# Patient Record
Sex: Male | Born: 2005 | Race: White | Hispanic: No | Marital: Single | Smoking: Never smoker
Health system: Southern US, Community
[De-identification: ages and names within clinical notes are randomized; demographics above are authoritative.]

---
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
## 2024-08-15 ENCOUNTER — Encounter: Payer: Self-pay | Admitting: Emergency Medicine

## 2024-08-15 ENCOUNTER — Emergency Department
Admission: EM | Admit: 2024-08-15 | Discharge: 2024-08-15 | Disposition: A | Discharge status: Home / Self Care | Attending: Emergency Medicine | Admitting: Emergency Medicine

## 2024-08-15 DIAGNOSIS — R109 Unspecified abdominal pain: Secondary | ICD-10-CM | POA: Diagnosis present

## 2024-08-15 DIAGNOSIS — L720 Epidermal cyst: Secondary | ICD-10-CM | POA: Diagnosis not present

## 2024-08-15 MED ORDER — DOXYCYCLINE HYCLATE 100 MG PO TABS: 100.0000 mg | ORAL_TABLET | Freq: Two times a day (BID) | ORAL | 0 refills | Status: AC

## 2024-08-15 NOTE — ED Provider Notes (Signed)
 "  [facility] Provider Note    Event Date/Time   First MD Initiated Contact with Patient 08/15/24 1512     (approximate)   History   Hernia    HPI  Philip Macias is a 19 y.o. male    with a past medical history of bilateral tinnitus, left wrist pain, neoplasm of the skin, hemochromatosis abdominal pain,, with no significant past medical history who presents to the ED complaining of bellybutton pain. According to the patient, symptoms started 24 hours ago with bloody secretion from his bellybutton.  Patient endorses is tender to palpation.  Patient denies fever, chills, abdominal pain, diarrhea.  This is his first episode.  He was itchy and patient was scratching it.  Patient is here with his mother.    Per independent chart review, patient had similar symptoms in March 17, 2018 with periumbilical abdominal pain for 2 weeks x-ray were normal  There are no active problems to display for this patient.    Physical Exam   Triage Vital Signs: ED Triage Vitals  Encounter Vitals Group     BP 08/15/24 1424 134/85     Girls Systolic BP Percentile --      Girls Diastolic BP Percentile --      Boys Systolic BP Percentile --      Boys Diastolic BP Percentile --      Pulse Rate 08/15/24 1424 79     Resp 08/15/24 1424 16     Temp 08/15/24 1426 98.6 F (37 C)     Temp Source 08/15/24 1426 Oral     SpO2 08/15/24 1424 100 %     Weight --      Height --      Head Circumference --      Peak Flow --      Pain Score 08/15/24 1424 0     Pain Loc --      Pain Education --      Exclude from Growth Chart --     Most recent vital signs: Vitals:   08/15/24 1424 08/15/24 1426  BP: 134/85   Pulse: 79   Resp: 16   Temp:  98.6 F (37 C)  SpO2: 100%      Physical Exam Vitals and nursing note reviewed.  During triage patient was hypertensive.  General:          Awake, no distress.  CV:                  Good peripheral perfusion. Regular rate and rhythm. Resp:                Normal effort. no tachypnea.Equal breath sounds bilaterally.  Abd:             Bowel sounds positive, skin is intact.    No distention.  Soft nontender.  Of induration in the upper border of the umbilical area.  Bloody dry secretion, I did clean it and obtain fresh scared blood. Other:               ED Results / Procedures / Treatments   Labs (all labs ordered are listed, but only abnormal results are displayed) Labs Reviewed - No data to display     RADIOLOGY I independently reviewed and interpreted imaging and agree with radiologists findings.      PROCEDURES:  Critical Care performed:   .Ultrasound ED Soft Tissue  Date/Time: 08/15/2024 3:52 PM  Performed by: Janit Kast, PA-C  Authorized by: Janit Kast, PA-C   Procedure details:    Indications: localization of abscess     Transverse view:  Visualized   Longitudinal view:  Visualized   Images: not archived   Location:    Location: abdominal wall     Side:  Midline Comments:     Presence of cyst.    MEDICATIONS ORDERED IN ED: Medications - No data to display    IMPRESSION / MDM / ASSESSMENT AND PLAN / ED COURSE  I reviewed the triage vital signs and the nursing notes.  Differential diagnosis includes, but is not limited to, umbilical cyst, umbilical abscess, unlikely foreign body.  Patient's presentation is most consistent with acute, uncomplicated illness.   Philip Macias is a 19 y.o., male who presents today with history of 24 hours of periumbilical pain with drainage.  See HPI for further information.  On a physical exam patient was hypertensive in triage.  Cardiopulmonary is clear.  Abdomen, bowel sounds positive, soft nontender to palpation.  Presence of induration in the upper edge of the umbilical area, presence of dried blood.  After cleaning dried blood with presence of fresh blood.  Tender to deep palpation.  Under visualization with ultrasound presence of cyst. Patient's diagnosis  is consistent with periumbilical epidermoid cyst. I independently reviewed and interpreted imaging and agree with radiologists findings. I did review the patient's allergies and medications.The patient is in stable and satisfactory condition for discharge home  Patient will be discharged home with prescriptions for doxycycline . Patient is to follow up with PCP as needed or otherwise directed. Patient is given ED precautions to return to the ED for any worsening or new symptoms. Discussed plan of care with patient, answered all of patient's questions, patient agreeable to plan of care. Advised patient to take medications according to the instructions on the label. Discussed possible side effects of new medications. Patient verbalized understanding.  FINAL CLINICAL IMPRESSION(S) / ED DIAGNOSES   Final diagnoses:  Epidermoid cyst     Rx / DC Orders   ED Discharge Orders          Ordered    doxycycline  (VIBRA -TABS) 100 MG tablet  2 times daily        08/15/24 1600             Note:  This document was prepared using Dragon voice recognition software and may include unintentional dictation errors.   Janit Kast, PA-C 08/15/24 1600    Bradler, Evan K, MD 08/15/24 2318  "

## 2024-08-15 NOTE — ED Triage Notes (Signed)
 Pt reports possible hernia. Endorses belly button pain since last Wednesday. Denies n/v or abd pain. Reports some bleeding from site last night.

## 2024-08-15 NOTE — Discharge Instructions (Addendum)
 You have been diagnosed with epidermoid cyst.  Please take doxycycline  1 tablet by mouth every 12 hours for 7 days.  Please drink plenty of fluids.  Please do not take the antibiotic with milk.  Please avoid laying down after taking the antibiotic.  Wait at least 30 minutes.  Please come back to ED or go to your PCP if you have new symptoms or symptoms worsen.  It was a pleasure to help you today.  Brena Windsor, PA-C
